# Patient Record
Sex: Male | Born: 1990 | Race: White | Hispanic: No | Marital: Single | Smoking: Never smoker
Health system: Southern US, Community
[De-identification: ages and names within clinical notes are randomized; demographics above are authoritative.]

## PROBLEM LIST (undated history)

## (undated) DIAGNOSIS — R51 Headache: Secondary | ICD-10-CM

## (undated) DIAGNOSIS — J302 Other seasonal allergic rhinitis: Secondary | ICD-10-CM

## (undated) DIAGNOSIS — T753XXA Motion sickness, initial encounter: Secondary | ICD-10-CM

## (undated) DIAGNOSIS — R519 Headache, unspecified: Secondary | ICD-10-CM

## (undated) DIAGNOSIS — J45909 Unspecified asthma, uncomplicated: Secondary | ICD-10-CM

## (undated) HISTORY — PX: WISDOM TOOTH EXTRACTION: SHX21

---
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
## 2010-12-04 ENCOUNTER — Ambulatory Visit: Payer: Self-pay | Admitting: Family Medicine

## 2012-07-29 ENCOUNTER — Ambulatory Visit: Payer: Self-pay | Admitting: Family Medicine

## 2015-11-08 ENCOUNTER — Other Ambulatory Visit: Payer: Self-pay | Admitting: Otolaryngology

## 2015-11-08 DIAGNOSIS — L720 Epidermal cyst: Secondary | ICD-10-CM

## 2015-11-18 ENCOUNTER — Ambulatory Visit
Admission: RE | Admit: 2015-11-18 | Discharge: 2015-11-18 | Disposition: A | Discharge status: Home / Self Care | Payer: Managed Care, Other (non HMO) | Source: Ambulatory Visit | Attending: Otolaryngology | Admitting: Otolaryngology

## 2015-11-18 ENCOUNTER — Ambulatory Visit: Payer: Managed Care, Other (non HMO)

## 2015-11-18 DIAGNOSIS — D1779 Benign lipomatous neoplasm of other sites: Secondary | ICD-10-CM | POA: Insufficient documentation

## 2015-11-18 DIAGNOSIS — L720 Epidermal cyst: Secondary | ICD-10-CM | POA: Diagnosis present

## 2015-12-19 ENCOUNTER — Encounter: Payer: Self-pay | Admitting: *Deleted

## 2015-12-25 NOTE — Discharge Instructions (Signed)

## 2015-12-27 ENCOUNTER — Encounter
Admission: RE | Disposition: A | Discharge status: Home / Self Care | Payer: Self-pay | Source: Ambulatory Visit | Attending: Otolaryngology

## 2015-12-27 ENCOUNTER — Ambulatory Visit: Payer: Managed Care, Other (non HMO) | Admitting: Anesthesiology

## 2015-12-27 ENCOUNTER — Ambulatory Visit
Admission: RE | Admit: 2015-12-27 | Discharge: 2015-12-27 | Disposition: A | Discharge status: Home / Self Care | Payer: Managed Care, Other (non HMO) | Source: Ambulatory Visit | Attending: Otolaryngology | Admitting: Otolaryngology

## 2015-12-27 DIAGNOSIS — D170 Benign lipomatous neoplasm of skin and subcutaneous tissue of head, face and neck: Secondary | ICD-10-CM | POA: Diagnosis not present

## 2015-12-27 DIAGNOSIS — Z888 Allergy status to other drugs, medicaments and biological substances status: Secondary | ICD-10-CM | POA: Diagnosis not present

## 2015-12-27 DIAGNOSIS — Z79899 Other long term (current) drug therapy: Secondary | ICD-10-CM | POA: Insufficient documentation

## 2015-12-27 DIAGNOSIS — Z880 Allergy status to penicillin: Secondary | ICD-10-CM | POA: Insufficient documentation

## 2015-12-27 DIAGNOSIS — R220 Localized swelling, mass and lump, head: Secondary | ICD-10-CM | POA: Diagnosis present

## 2015-12-27 HISTORY — DX: Headache: R51

## 2015-12-27 HISTORY — DX: Motion sickness, initial encounter: T75.3XXA

## 2015-12-27 HISTORY — PX: LIPOMA EXCISION: SHX5283

## 2015-12-27 HISTORY — DX: Other seasonal allergic rhinitis: J30.2

## 2015-12-27 HISTORY — DX: Unspecified asthma, uncomplicated: J45.909

## 2015-12-27 HISTORY — DX: Headache, unspecified: R51.9

## 2015-12-27 SURGERY — EXCISION LIPOMA
Anesthesia: General | Site: Ear | Laterality: Left | Wound class: Clean

## 2015-12-27 MED ORDER — LACTATED RINGERS IV SOLN
INTRAVENOUS | Status: DC
  Administered 2015-12-27: 08:00:00 via INTRAVENOUS

## 2015-12-27 MED ORDER — PROPOFOL 10 MG/ML IV BOLUS
INTRAVENOUS | Status: DC | PRN
  Administered 2015-12-27: 750 mg via INTRAVENOUS
  Administered 2015-12-27: 50 mg via INTRAVENOUS

## 2015-12-27 MED ORDER — ONDANSETRON HCL 4 MG/2ML IJ SOLN: 4.0000 mg | Freq: Once | INTRAMUSCULAR | Status: DC | PRN

## 2015-12-27 MED ORDER — MIDAZOLAM HCL 5 MG/5ML IJ SOLN
INTRAMUSCULAR | Status: DC | PRN
  Administered 2015-12-27: 2 mg via INTRAVENOUS

## 2015-12-27 MED ORDER — LIDOCAINE-EPINEPHRINE 1 %-1:100000 IJ SOLN
INTRAMUSCULAR | Status: DC | PRN
  Administered 2015-12-27: 8 mL

## 2015-12-27 MED ORDER — FENTANYL CITRATE (PF) 100 MCG/2ML IJ SOLN: 25.0000 ug | INTRAMUSCULAR | Status: DC | PRN

## 2015-12-27 MED ORDER — ONDANSETRON HCL 4 MG/2ML IJ SOLN
INTRAMUSCULAR | Status: DC | PRN
  Administered 2015-12-27: 4 mg via INTRAVENOUS

## 2015-12-27 MED ORDER — FENTANYL CITRATE (PF) 100 MCG/2ML IJ SOLN
INTRAMUSCULAR | Status: DC | PRN
  Administered 2015-12-27 (×2): 50 ug via INTRAVENOUS

## 2015-12-27 MED ORDER — BACITRACIN 500 UNIT/GM EX OINT
TOPICAL_OINTMENT | CUTANEOUS | Status: DC | PRN
  Administered 2015-12-27: 1 via TOPICAL

## 2015-12-27 MED ORDER — LIDOCAINE HCL (CARDIAC) 20 MG/ML IV SOLN
INTRAVENOUS | Status: DC | PRN
  Administered 2015-12-27: 50 mg via INTRATRACHEAL

## 2015-12-27 MED ORDER — DEXAMETHASONE SODIUM PHOSPHATE 4 MG/ML IJ SOLN
INTRAMUSCULAR | Status: DC | PRN
  Administered 2015-12-27: 8 mg via INTRAVENOUS

## 2015-12-27 MED ORDER — GLYCOPYRROLATE 0.2 MG/ML IJ SOLN
INTRAMUSCULAR | Status: DC | PRN
  Administered 2015-12-27: 0.1 mg via INTRAVENOUS

## 2015-12-27 SURGICAL SUPPLY — 30 items
BLADE SURG 15 STRL LF DISP TIS (BLADE) IMPLANT
BLADE SURG 15 STRL SS (BLADE)
CORD BIP STRL DISP 12FT (MISCELLANEOUS) IMPLANT
DRAPE HEAD BAR (DRAPES) ×3 IMPLANT
DRESSING TELFA 4X3 1S ST N-ADH (GAUZE/BANDAGES/DRESSINGS) IMPLANT
DRSG TEGADERM 4X4.75 (GAUZE/BANDAGES/DRESSINGS) ×3 IMPLANT
DRSG TELFA 3X8 NADH (GAUZE/BANDAGES/DRESSINGS) ×3 IMPLANT
ELECT CAUTERY BLADE TIP 2.5 (TIP)
ELECT CAUTERY NEEDLE 2.0 MIC (NEEDLE) ×3 IMPLANT
ELECTRODE CAUTERY BLDE TIP 2.5 (TIP) IMPLANT
GAUZE SPONGE 4X4 12PLY STRL (GAUZE/BANDAGES/DRESSINGS) IMPLANT
GLOVE PI ULTRA LF STRL 7.5 (GLOVE) ×2 IMPLANT
GLOVE PI ULTRA NON LATEX 7.5 (GLOVE) ×4
KIT ROOM TURNOVER OR (KITS) ×3 IMPLANT
LIQUID BAND (GAUZE/BANDAGES/DRESSINGS) IMPLANT
NEEDLE HYPO 25GX1X1/2 BEV (NEEDLE) ×3 IMPLANT
NS IRRIG 500ML POUR BTL (IV SOLUTION) ×3 IMPLANT
PACK DRAPE NASAL/ENT (PACKS) ×3 IMPLANT
PAD GROUND ADULT SPLIT (MISCELLANEOUS) ×3 IMPLANT
PENCIL ELECTRO HAND CTR (MISCELLANEOUS) ×3 IMPLANT
SOL PREP PVP 2OZ (MISCELLANEOUS) ×3
SOLUTION PREP PVP 2OZ (MISCELLANEOUS) ×1 IMPLANT
STRAP BODY AND KNEE 60X3 (MISCELLANEOUS) ×3 IMPLANT
SUCTION FRAZIER TIP 10 FR DISP (SUCTIONS) IMPLANT
SUT ETHILON 5-0 FS-2 18 BLK (SUTURE) ×3 IMPLANT
SUT PROLENE 5 0 P 3 (SUTURE) ×3 IMPLANT
SUT VIC AB 4-0 RB1 27 (SUTURE) ×2
SUT VIC AB 4-0 RB1 27X BRD (SUTURE) ×1 IMPLANT
SYRINGE 10CC LL (SYRINGE) ×3 IMPLANT
SYSTEM CHEST DRAIN TLS 7FR (DRAIN) ×3 IMPLANT

## 2015-12-27 NOTE — Transfer of Care (Signed)
Immediate Anesthesia Transfer of Care Note  Patient: Thomas Galvan  Procedure(s) Performed: Procedure(s): excision posterior auricular cyst left (Left)  Patient Location: PACU  Anesthesia Type: General LMA  Level of Consciousness: awake, alert  and patient cooperative  Airway and Oxygen Therapy: Patient Spontanous Breathing and Patient connected to supplemental oxygen  Post-op Assessment: Post-op Vital signs reviewed, Patient's Cardiovascular Status Stable, Respiratory Function Stable, Patent Airway and No signs of Nausea or vomiting  Post-op Vital Signs: Reviewed and stable  Complications: No apparent anesthesia complications

## 2015-12-27 NOTE — Anesthesia Preprocedure Evaluation (Signed)
Anesthesia Evaluation  Patient identified by MRN, date of birth, ID band  Reviewed: Allergy & Precautions, H&P , NPO status , Patient's Chart, lab work & pertinent test results  Airway Mallampati: II  TM Distance: >3 FB Neck ROM: full    Dental no notable dental hx.    Pulmonary    Pulmonary exam normal        Cardiovascular  Rhythm:regular Rate:Normal     Neuro/Psych    GI/Hepatic   Endo/Other    Renal/GU      Musculoskeletal   Abdominal   Peds  Hematology   Anesthesia Other Findings   Reproductive/Obstetrics                             Anesthesia Physical Anesthesia Plan  ASA: I  Anesthesia Plan: General LMA   Post-op Pain Management:    Induction:   Airway Management Planned:   Additional Equipment:   Intra-op Plan:   Post-operative Plan:   Informed Consent: I have reviewed the patients History and Physical, chart, labs and discussed the procedure including the risks, benefits and alternatives for the proposed anesthesia with the patient or authorized representative who has indicated his/her understanding and acceptance.     Plan Discussed with: CRNA  Anesthesia Plan Comments:         Anesthesia Quick Evaluation

## 2015-12-27 NOTE — OR Nursing (Signed)
TLS DRAIN # 7 MM PLACED POSTERIOR LEFT AURICULAR BY DR Kathyrn Sheriff

## 2015-12-27 NOTE — Op Note (Signed)
12/27/2015  9:19 AM    Thomas Galvan  MJ:[id number]   Pre-Op Dx:  Left postauricular mass  Post-op Dx: Left postauricular mass most likely sebaceous cyst  Proc: Excision left postauricular mass, sized 3 x 2 cm   Surg:  Thomas Galvan  Anes:  GOT  EBL:  5 mL  Comp:  None  Findings:  Large mass that had an oily fluid filling it. There also appeared to be some sebum inside it  Procedure: Patient was given general anesthesia by laryngeal mask anesthesia. The patient was in a supine position with his head turned to the right. The left postauricular mass was marked first and then 4 mL of 1% Xylocaine with epi 100,000 was used for infiltration into the left postauricular area. He was then prepped and draped sterile fashion not using iodine products.  Incision was made in the left postauricular crease with elevation of the auricle anteriorly and then elevation of skin over the top of this more posteriorly. As it was being elevated superiorly small hole was placed in a year oil fluid seeped out to deflate the mass. Inside small hole he could see sebum-like material. Care was taken to get around the entire sac make sure the entire sac was removed. Dissection was carried superiorly above the mass and then down to the fascia of the temporalis muscle. Section was carried underneath the mass from superior to inferior make sure the entire cyst was removed. Anterior attachments were freed up and posterior attachments were freed up, and then the inferior attachments were finally freed. It was completely removed and the sac was packed other then 2 small holes or put in it superiorly. Bleeding was controlled with bipolar cautery. Wound was copiously irrigated there is no further bleeding.  A 7 French TLS drain was placed through an inferior stab incision. The dermis and subcutaneous was closed using 50 Vicryls in interrupted sutures and then a 5-0 running locking nylon suture was used for closure of the  skin. His created a slight depression in the left postauricular crease where the mass had been. The wound was covered with bacitracin, Telfa, and a Tegaderm dressing. The drain Was taped to the skin.  Dispo:   To PACU to be discharged home  Plan:  To follow-up in the office in 1 week for suture removal. He is to remove the drain tomorrow and can just put a Band-Aid over the wound. We'll give him some pain medication if needed and we will cover with an antibiotic postoperatively.  Thomas Galvan  12/27/2015 9:19 AM

## 2015-12-27 NOTE — Anesthesia Postprocedure Evaluation (Signed)
Anesthesia Post Note  Patient: Thomas Galvan  Procedure(s) Performed: Procedure(s) (LRB): excision posterior auricular cyst left (Left)  Patient location during evaluation: PACU Anesthesia Type: General Level of consciousness: awake and alert and oriented Pain management: satisfactory to patient Vital Signs Assessment: post-procedure vital signs reviewed and stable Respiratory status: spontaneous breathing, nonlabored ventilation and respiratory function stable Cardiovascular status: blood pressure returned to baseline and stable Postop Assessment: Adequate PO intake and No signs of nausea or vomiting Anesthetic complications: no    Raliegh Ip

## 2015-12-27 NOTE — Anesthesia Procedure Notes (Signed)
Procedure Name: LMA Insertion Performed by: Londell Moh Pre-anesthesia Checklist: Patient identified, Emergency Drugs available, Suction available, Timeout performed and Patient being monitored Patient Re-evaluated:Patient Re-evaluated prior to inductionOxygen Delivery Method: Circle system utilized Preoxygenation: Pre-oxygenation with 100% oxygen Intubation Type: IV induction LMA: LMA inserted LMA Size: 4.0 Number of attempts: 1 Placement Confirmation: positive ETCO2 and breath sounds checked- equal and bilateral Tube secured with: Tape

## 2015-12-30 NOTE — H&P (Signed)
  H&P has been reviewed and no changes necessary. To be downloaded later. 

## 2015-12-31 ENCOUNTER — Encounter: Payer: Self-pay | Admitting: Otolaryngology

## 2015-12-31 LAB — SURGICAL PATHOLOGY

## 2016-06-04 IMAGING — CT CT TEMPORAL BONES W/O CM
2 of 6 series · 12 of 40 positions shown, 15 images · non-contrast
Comparison: 06/12/2008

CLINICAL DATA: Cyst behind the left ear, 2 years duration. Chronic
ear infections.

EXAM:
CT TEMPORAL BONES WITHOUT CONTRAST
TECHNIQUE: Axial and coronal plane CT imaging of the petrous temporal bones was
performed with thin-collimation image reconstruction. No intravenous
contrast was administered. Multiplanar CT image reconstructions were
also generated.

[Series 4: coronal bone · coronal · 0.13mm/px · 2 of 218 slices shown]
[im 73/218  bone]
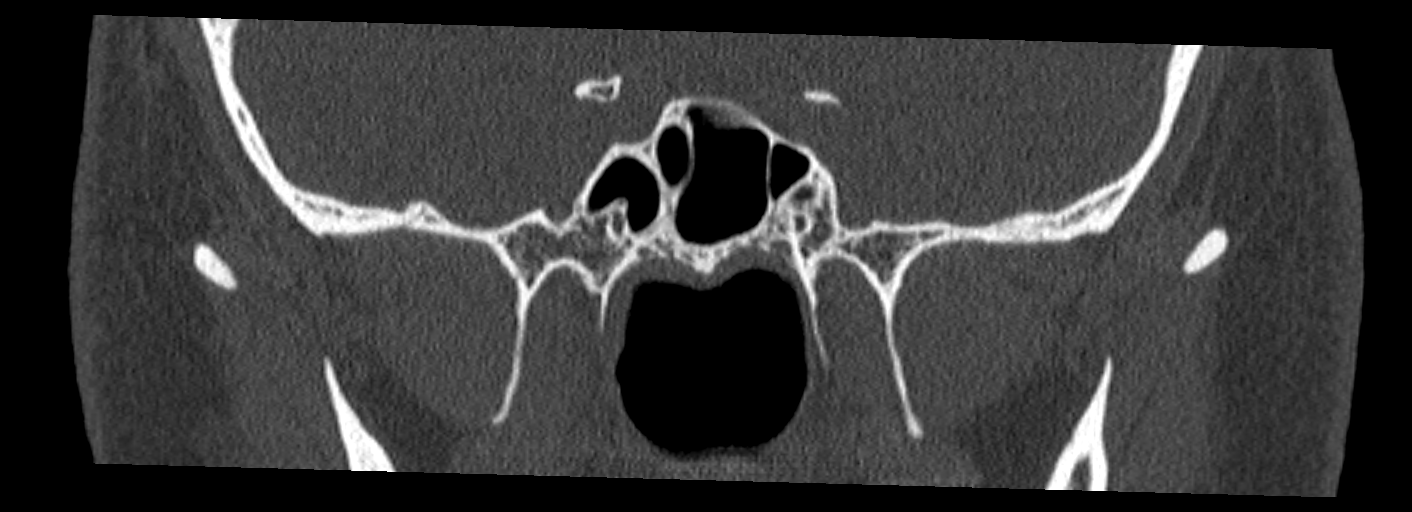
[im 145/218  bone]
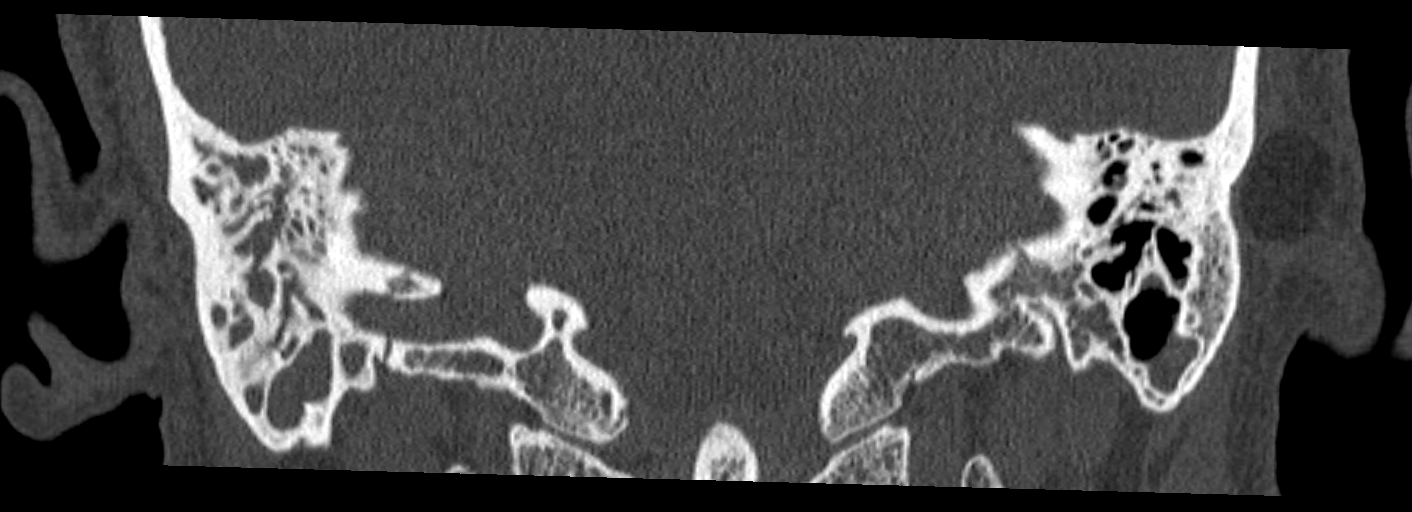

[Series 5: ax mag right · axial · 0.20mm/px · z∈[-104,-56]mm · 10 of 97 slices shown, 13 images]
[im 9/97  brain]
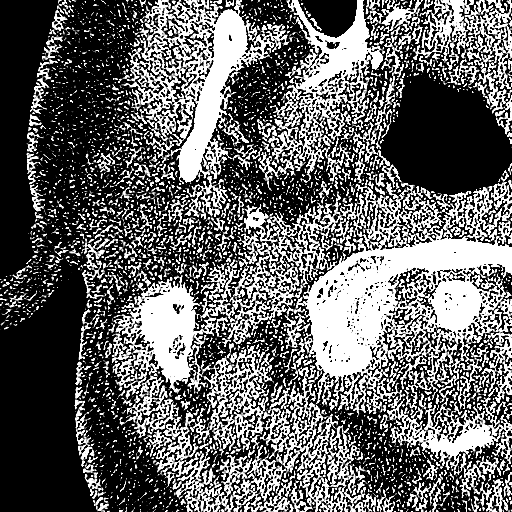
[im 9/97  bone]
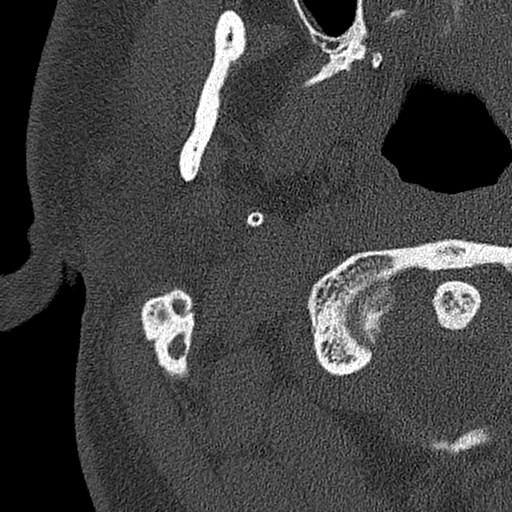
[im 17/97  bone]
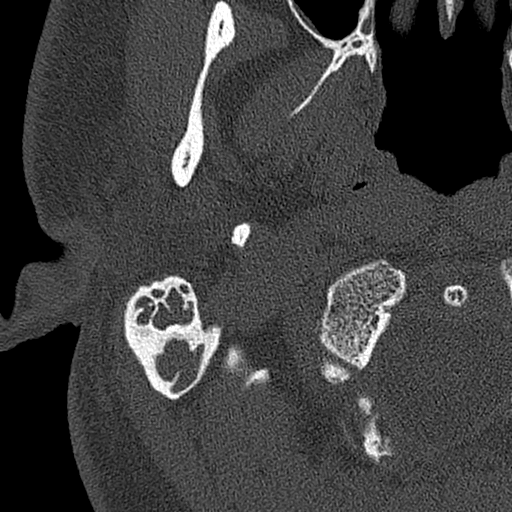
[im 25/97  bone]
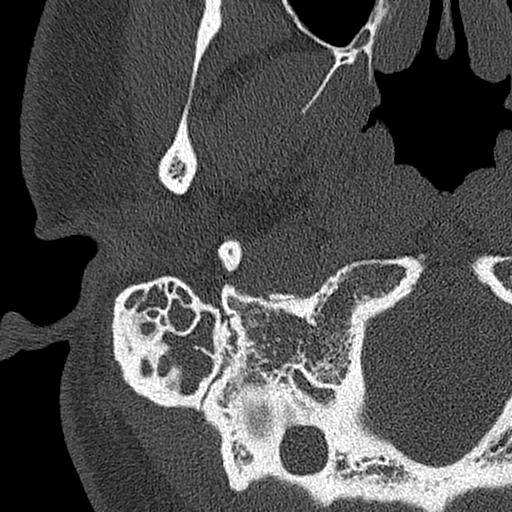
[im 33/97  bone]
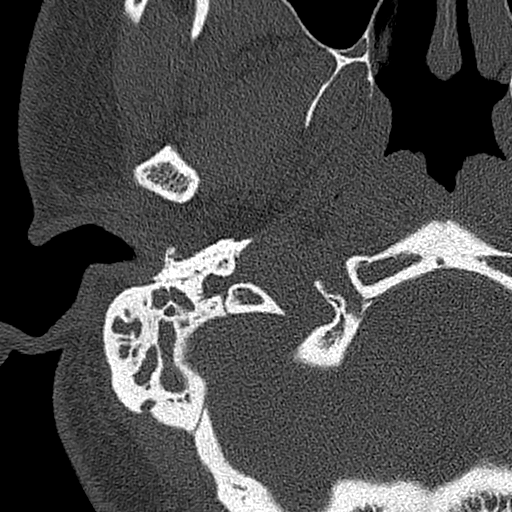
[im 41/97  brain]
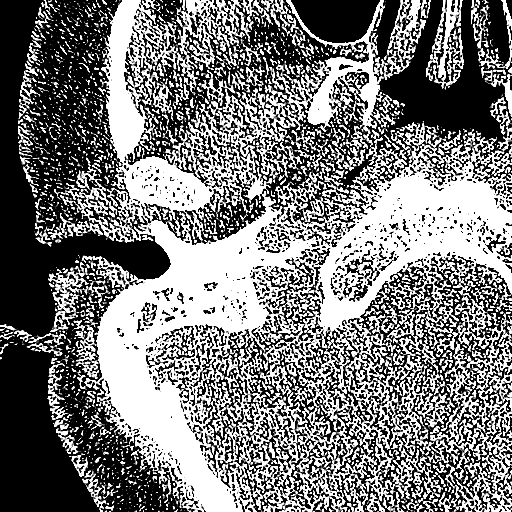
[im 41/97  bone]
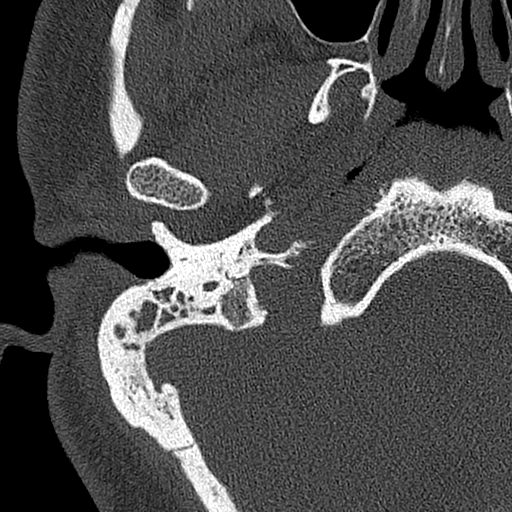
[im 57/97  bone]
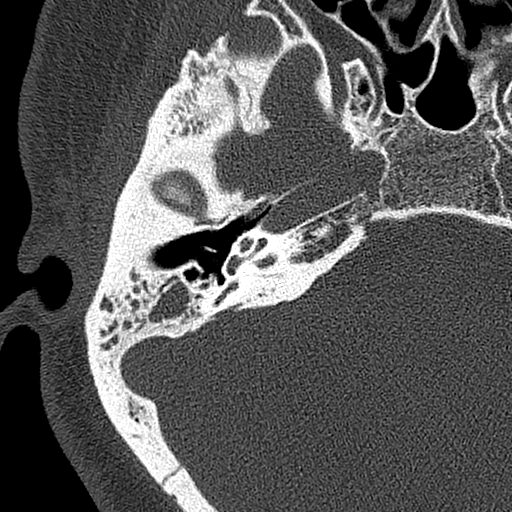
[im 65/97  bone]
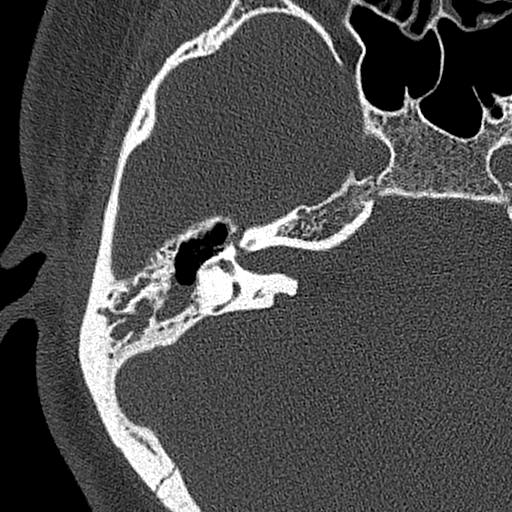
[im 73/97  bone]
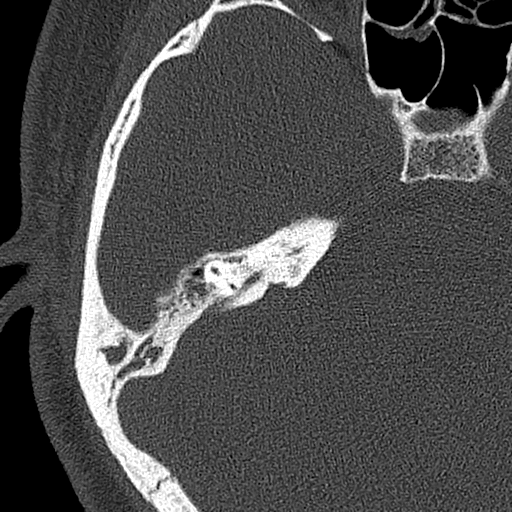
[im 81/97  brain]
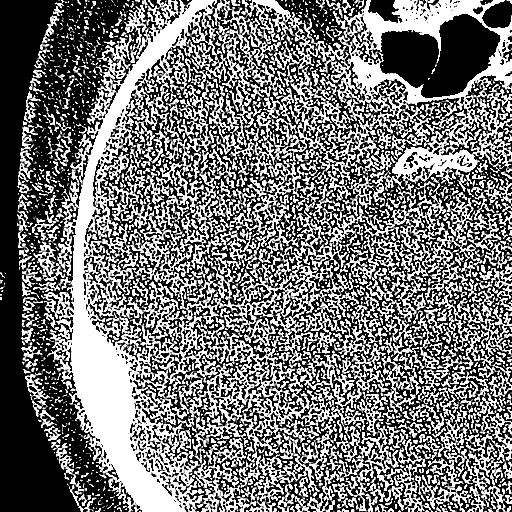
[im 81/97  bone]
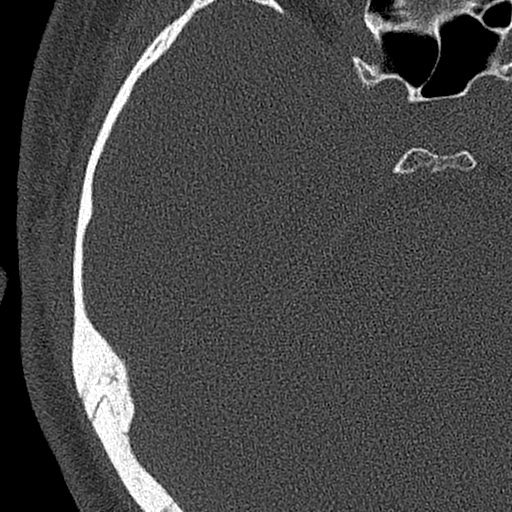
[im 89/97  bone]
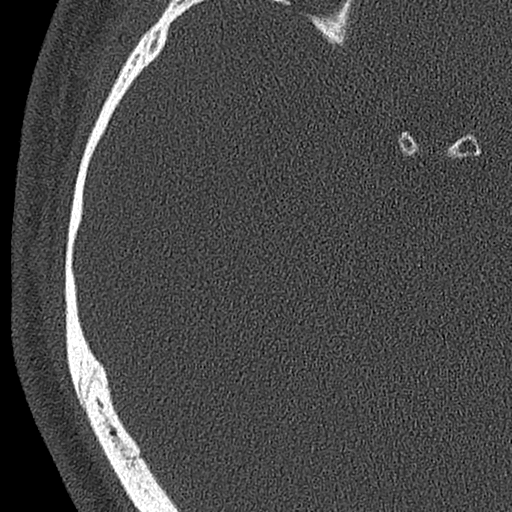

[12 of 40 positions shown; findings below may reference images not displayed]

FINDINGS: There is a lipoma/enlarging fatty tumor within the subcutaneous
tissues posterior to the external auditory canal on the left with
measurements of 18 mm cephalocaudal, 28 mm front to back and 15 mm
side to side. This has approximately doubled in size since 5447. The
margins remain well defined in the internal material appears quite
homogeneous. No evidence of involvement of the calvarium.

Mastoid air cells on the right are completely opacified without
visible bone destruction. There is fluid in the attic. The middle
ear is clear. The ossicles appear normal. Inner ear structures
appear normal.

Mastoid air cells on the left show a few opacified air cells
inferiorly. No fluid in the middle ear. Ossicles appear normal.
Inner ear structures appear normal.

Visualized intracranial structures are unremarkable. Visualized
paranasal sinuses are clear.
IMPRESSION: The palpable abnormality represents a fatty tumor within the
subcutaneous tissues measuring 1.8 x 2.8 x 1.5 cm. This is well
marginated and internally homogeneous and is most consistent with a
benign lipoma. Low grade liposarcoma cannot be differentiated by
imaging but would be much more rare. This has approximately doubled
in size since 5447.

Fluid throughout the mastoid air cells and attic on the right.

Small amount of fluid in the mastoid tip air cells on the left.

## 2022-06-29 ENCOUNTER — Ambulatory Visit
Admission: RE | Admit: 2022-06-29 | Discharge: 2022-06-29 | Disposition: A | Discharge status: Home / Self Care | Payer: Commercial Managed Care - PPO | Source: Ambulatory Visit

## 2022-06-29 VITALS — BP 134/73 | HR 69 | Temp 97.7°F | Resp 16 | Ht 68.0 in | Wt 225.1 lb

## 2022-06-29 DIAGNOSIS — H6501 Acute serous otitis media, right ear: Secondary | ICD-10-CM

## 2022-06-29 DIAGNOSIS — H9191 Unspecified hearing loss, right ear: Secondary | ICD-10-CM

## 2022-06-29 NOTE — ED Provider Notes (Signed)
[facility]    CSN: [id number] Arrival date & time: 06/29/22  1746      History   Chief Complaint Chief Complaint  Patient presents with   Appointment   Otalgia    right    HPI Thomas Galvan is a 31 y.o. male presenting for 3-week history of right-sided ear pain which she rates about 3 out of 10.  Also reports ear fullness and diminished hearing on the right side.  He has not had any nasal congestion, cough or sore throat.  He denies any drainage from the ear.  No headaches or dizziness.  Has been treating with Sudafed which has helped.  HPI  Past Medical History:  Diagnosis Date   Asthma    as child   Headache    migraines - every few months -Allergies/nicotene exposure   Motion sickness    extended car trips   Seasonal allergies     There are no problems to display for this patient.   Past Surgical History:  Procedure Laterality Date   LIPOMA EXCISION Left 12/27/2015   Procedure: excision posterior auricular cyst left;  Surgeon: Margaretha Sheffield, MD;  Location: [facility];  Service: ENT;  Laterality: Left;   WISDOM TOOTH EXTRACTION         Home Medications    Prior to Admission medications   Medication Sig Start Date End Date Taking? Authorizing Provider  Butalbital-Acetaminophen (BUPAP) 50-300 MG TABS Take by mouth as needed.    [provider]  diphenhydrAMINE (BENADRYL) 25 MG tablet Take 25 mg by mouth every 6 (six) hours as needed.    [provider]  Multiple Vitamin (MULTIVITAMIN) capsule Take 1 capsule by mouth daily.    [provider]    Family History No family history on file.  Social History Social History   Tobacco Use   Smoking status: Never   Smokeless tobacco: Never   Tobacco comments:    Tried cigs as teen.  caused migraines  Vaping Use   Vaping Use: Never used  Substance Use Topics   Alcohol use: Yes    Comment: rare - 2-3 beers every few months   Drug use: Never      Allergies   Lobster [shellfish allergy], Oxycodone, Sweet potato, Penicillins, and Vantin [cefpodoxime]   Review of Systems Review of Systems  Constitutional:  Negative for fatigue and fever.  HENT:  Positive for ear pain and hearing loss. Negative for congestion, ear discharge, facial swelling, sinus pain and sore throat.   Respiratory:  Negative for cough.   Neurological:  Negative for dizziness and headaches.     Physical Exam Triage Vital Signs ED Triage Vitals  Enc Vitals Group     BP 06/29/22 1822 134/73     Pulse Rate 06/29/22 1822 69     Resp 06/29/22 1822 16     Temp 06/29/22 1822 97.7 F (36.5 C)     Temp Source 06/29/22 1822 Oral     SpO2 06/29/22 1822 98 %     Weight 06/29/22 1820 225 lb 1.4 oz (102.1 kg)     Height 06/29/22 1820 '5\' 8"'$  (1.727 m)     Head Circumference --      Peak Flow --      Pain Score 06/29/22 1819 4     Pain Loc --      Pain Edu? --      Excl. in [place]? --    No data found.  Updated Vital Signs BP 134/73 (BP Location: Right Arm)   Pulse 69   Temp 97.7 F (36.5 C) (Oral)   Resp 16   Ht '5\' 8"'$  (1.727 m)   Wt 225 lb 1.4 oz (102.1 kg)   SpO2 98%   BMI 34.22 kg/m      Physical Exam Vitals and nursing note reviewed.  Constitutional:      General: He is not in acute distress.    Appearance: Normal appearance. He is well-developed. He is not ill-appearing.  HENT:     Head: Normocephalic and atraumatic.     Right Ear: Ear canal and external ear normal. A middle ear effusion is present. Tympanic membrane is bulging.     Left Ear: Ear canal and external ear normal.     Nose: Congestion present.     Mouth/Throat:     Mouth: Mucous membranes are moist.     Pharynx: Oropharynx is clear.  Eyes:     General: No scleral icterus.    Conjunctiva/sclera: Conjunctivae normal.  Cardiovascular:     Rate and Rhythm: Normal rate.  Pulmonary:     Effort: Pulmonary effort is normal. No respiratory distress.  Musculoskeletal:     Cervical  back: Neck supple.  Skin:    General: Skin is warm and dry.     Capillary Refill: Capillary refill takes less than 2 seconds.  Neurological:     General: No focal deficit present.     Mental Status: He is alert. Mental status is at baseline.     Motor: No weakness.     Gait: Gait normal.  Psychiatric:        Mood and Affect: Mood normal.      UC Treatments / Results  Labs (all labs ordered are listed, but only abnormal results are displayed) Labs Reviewed - No data to display  EKG   Radiology No results found.  Procedures Procedures (including critical care time)  Medications Ordered in UC Medications - No data to display  Initial Impression / Assessment and Plan / UC Course  I have reviewed the triage vital signs and the nursing notes.  Pertinent labs & imaging results that were available during my care of the patient were reviewed by me and considered in my medical decision making (see chart for details).   31 year old male presents for right ear pain, ear pressure and reduced hearing for the past 3 weeks.  On exam he does have effusion of the right TM with slight bulging at the lower 50%.  There is no erythema.  He has some mild nasal congestion.  Advised patient he has otitis media with effusion.  Advised him that treatment is mostly supportive with continuing decongestants and starting a nasal spray.  Reviewed returning if he has a fever or worsening ear pain.   Final Clinical Impressions(s) / UC Diagnoses   Final diagnoses:  Right acute serous otitis media, recurrence not specified  Hearing loss of right ear, unspecified hearing loss type     Discharge Instructions      -Continue Sudafed.  Actually you may consider Claritin-D which has an antihistamine and decongestant.  You would need to ask the pharmacy staff for this medication as you need to show an ID for it.  Also use Flonase.  You can use ibuprofen and/or Tylenol as well as needed for discomfort. - As  we discussed, it can take a couple of weeks for the fluid to dry up but you should return  if you develop a fever or have increased ear pain, drainage from the ear, sinus pain, etc.    ED Prescriptions   None    PDMP not reviewed this encounter.   Danton Clap, PA-C 06/29/22 1921

## 2022-06-29 NOTE — Discharge Instructions (Signed)
-  Continue Sudafed.  Actually you may consider Claritin-D which has an antihistamine and decongestant.  You would need to ask the pharmacy staff for this medication as you need to show an ID for it.  Also use Flonase.  You can use ibuprofen and/or Tylenol as well as needed for discomfort. - As we discussed, it can take a couple of weeks for the fluid to dry up but you should return if you develop a fever or have increased ear pain, drainage from the ear, sinus pain, etc.

## 2022-06-29 NOTE — ED Triage Notes (Signed)
Pt c/o right ear pain, fullness, and decreased hearing. Started about 3 weeks ago.
